# Patient Record
Sex: Female | Born: 1990 | Race: White | Hispanic: No | Marital: Single | Smoking: Current every day smoker
Health system: Southern US, Community
[De-identification: ages and names within clinical notes are randomized; demographics above are authoritative.]

## PROBLEM LIST (undated history)

## (undated) ENCOUNTER — Inpatient Hospital Stay (HOSPITAL_COMMUNITY): Payer: Self-pay

## (undated) DIAGNOSIS — G039 Meningitis, unspecified: Secondary | ICD-10-CM

## (undated) DIAGNOSIS — F329 Major depressive disorder, single episode, unspecified: Secondary | ICD-10-CM

## (undated) DIAGNOSIS — M5430 Sciatica, unspecified side: Secondary | ICD-10-CM

## (undated) DIAGNOSIS — F32A Depression, unspecified: Secondary | ICD-10-CM

## (undated) HISTORY — PX: WISDOM TOOTH EXTRACTION: SHX21

## (undated) HISTORY — PX: TONSILLECTOMY: SUR1361

---
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
## 2012-10-19 ENCOUNTER — Emergency Department: Payer: Self-pay | Admitting: Internal Medicine

## 2012-11-01 ENCOUNTER — Emergency Department: Payer: Self-pay | Admitting: Emergency Medicine

## 2015-11-30 ENCOUNTER — Emergency Department (HOSPITAL_COMMUNITY): Payer: Self-pay

## 2015-11-30 ENCOUNTER — Emergency Department (HOSPITAL_COMMUNITY)
Admission: EM | Admit: 2015-11-30 | Discharge: 2015-11-30 | Disposition: A | Discharge status: Home / Self Care | Payer: Self-pay | Attending: Emergency Medicine | Admitting: Emergency Medicine

## 2015-11-30 ENCOUNTER — Other Ambulatory Visit: Payer: Self-pay

## 2015-11-30 ENCOUNTER — Encounter (HOSPITAL_COMMUNITY): Payer: Self-pay

## 2015-11-30 DIAGNOSIS — R1013 Epigastric pain: Secondary | ICD-10-CM | POA: Insufficient documentation

## 2015-11-30 DIAGNOSIS — F17210 Nicotine dependence, cigarettes, uncomplicated: Secondary | ICD-10-CM | POA: Insufficient documentation

## 2015-11-30 DIAGNOSIS — R45851 Suicidal ideations: Secondary | ICD-10-CM | POA: Insufficient documentation

## 2015-11-30 HISTORY — DX: Major depressive disorder, single episode, unspecified: F32.9

## 2015-11-30 HISTORY — DX: Depression, unspecified: F32.A

## 2015-11-30 LAB — CBC
HEMATOCRIT: 44.7 % (ref 36.0–46.0)
HEMOGLOBIN: 14.8 g/dL (ref 12.0–15.0)
MCH: 29.1 pg (ref 26.0–34.0)
MCHC: 33.1 g/dL (ref 30.0–36.0)
MCV: 87.8 fL (ref 78.0–100.0)
Platelets: 415 10*3/uL — ABNORMAL HIGH (ref 150–400)
RBC: 5.09 MIL/uL (ref 3.87–5.11)
RDW: 13 % (ref 11.5–15.5)
WBC: 10.1 10*3/uL (ref 4.0–10.5)

## 2015-11-30 LAB — I-STAT BETA HCG BLOOD, ED (MC, WL, AP ONLY): I-stat hCG, quantitative: <5 m[IU]/mL (ref <5)

## 2015-11-30 LAB — COMPREHENSIVE METABOLIC PANEL
ALBUMIN: 4 g/dL (ref 3.5–5.0)
ALK PHOS: 77 U/L (ref 38–126)
ALT: 26 U/L (ref 14–54)
ANION GAP: 10 (ref 5–15)
AST: 25 U/L (ref 15–41)
BILIRUBIN TOTAL: 0.4 mg/dL (ref 0.3–1.2)
BUN: 14 mg/dL (ref 6–20)
CALCIUM: 9.1 mg/dL (ref 8.9–10.3)
CO2: 24 mmol/L (ref 22–32)
CREATININE: 0.62 mg/dL (ref 0.44–1.00)
Chloride: 104 mmol/L (ref 101–111)
GFR calc Af Amer: >60 mL/min (ref >60)
GFR calc non Af Amer: >60 mL/min (ref >60)
GLUCOSE: 88 mg/dL (ref 65–99)
Potassium: 3.8 mmol/L (ref 3.5–5.1)
Sodium: 138 mmol/L (ref 135–145)
TOTAL PROTEIN: 7.3 g/dL (ref 6.5–8.1)

## 2015-11-30 LAB — ACETAMINOPHEN LEVEL: Acetaminophen (Tylenol), Serum: <10 ug/mL — ABNORMAL LOW (ref 10–30)

## 2015-11-30 LAB — SALICYLATE LEVEL: Salicylate Lvl: <7 mg/dL (ref 2.8–30.0)

## 2015-11-30 LAB — ETHANOL: Alcohol, Ethyl (B): <5 mg/dL (ref <5)

## 2015-11-30 MED ORDER — GI COCKTAIL ~~LOC~~
30.0000 mL | Freq: Once | ORAL | Status: AC
Start: 2015-11-30 — End: 2015-11-30
  Administered 2015-11-30: 30 mL via ORAL
  Filled 2015-11-30: qty 30

## 2015-11-30 MED ORDER — ONDANSETRON 4 MG PO TBDP: 4.0000 mg | ORAL_TABLET | Freq: Three times a day (TID) | ORAL | 0 refills | Status: DC | PRN

## 2015-11-30 MED ORDER — ONDANSETRON 4 MG PO TBDP
4.0000 mg | ORAL_TABLET | Freq: Once | ORAL | Status: AC
  Administered 2015-11-30: 4 mg via ORAL
  Filled 2015-11-30: qty 1

## 2015-11-30 MED ORDER — ACETAMINOPHEN 500 MG PO TABS
1000.0000 mg | ORAL_TABLET | Freq: Once | ORAL | Status: AC
  Administered 2015-11-30: 1000 mg via ORAL
  Filled 2015-11-30: qty 2

## 2015-11-30 NOTE — Discharge Instructions (Signed)
Follow up with your family doc.  °Try zantac 150mg twice a day.  °

## 2015-11-30 NOTE — ED Notes (Signed)
Patient belongings inventory and placed in 1 container, and 1 bag.

## 2015-11-30 NOTE — ED Triage Notes (Signed)
Patient complains of anterior chest pain and cough for 1 day. On further questioning became tearful and states that she is depressed and has thoughts of hurting herself but doesn't think she could actually do it.

## 2015-11-30 NOTE — ED Notes (Signed)
Staffing office called for sitter. Changed into scrubs, security called to wand

## 2015-11-30 NOTE — ED Provider Notes (Signed)
[facility]-EMERGENCY DEPT Provider Note   CSN: [id number] Arrival date & time: 11/30/15  1114  By signing my name below, I, Kayla Drake, attest that this documentation has been prepared under the direction and in the presence of Kayla Planan Urania Pearlman, DO . Electronically Signed: Freida Busmaniana Drake, Scribe. 11/30/2015. 12:33 PM.  History   Chief Complaint Chief Complaint  Patient presents with  . Chest Pain/suicidal   The history is provided by the patient. No language interpreter was used.    HPI Comments:  Kayla Drake is a 25 y.o. female who presents to the Emergency Department complaining of intermittent, sharp, substernal pain since 0500 this AM that woke her from sleep. She also notes pain across her ribs and cramping diffuse abdominal pain.  She denies recent injury/fall. She notes associated vomiting today. She also notes cough x 3-4 days. She denies diarrhea, fever, and congestion.  No h/o PE/DVT, CA or long periods of immobilization. No alleviating factors noted; no treatments tried.   Pt mentions SI which she has been experiencing for years; no plan. She states she will not act upon these thoughts as she takes in strays and they are depending on her. She has tried to harm herself in the past but states she couldn't actually go through it. No diagnosed psych history.   Past Medical History:  Diagnosis Date  . Depression     There are no active problems to display for this patient.   History reviewed. No pertinent surgical history.  OB History    No data available       Home Medications    Prior to Admission medications   Medication Sig Start Date End Date Taking? Authorizing Provider  ondansetron (ZOFRAN ODT) 4 MG disintegrating tablet Take 1 tablet (4 mg total) by mouth every 8 (eight) hours as needed for nausea or vomiting. 11/30/15   Kayla Planan Cordero Surette, DO    Family History No family history on file.  Social History Social History  Substance Use Topics  . Smoking status:  Current Every Day Smoker    Types: Cigarettes  . Smokeless tobacco: Never Used  . Alcohol use 0.6 oz/week    1 Glasses of wine per week     Allergies   Patient has no known allergies.   Review of Systems Review of Systems  Constitutional: Negative for chills and fever.  HENT: Negative for congestion and rhinorrhea.   Eyes: Negative for redness and visual disturbance.  Respiratory: Positive for cough. Negative for shortness of breath and wheezing.   Cardiovascular: Positive for chest pain. Negative for palpitations.  Gastrointestinal: Positive for abdominal pain and vomiting.  Genitourinary: Negative for dysuria and urgency.  Musculoskeletal: Negative for arthralgias and myalgias.  Skin: Negative for pallor and wound.  Neurological: Negative for dizziness and headaches.  Psychiatric/Behavioral: Positive for suicidal ideas. Negative for self-injury.     Physical Exam Updated Vital Signs BP 110/76 (BP Location: Right Arm)   Pulse 80   Temp 98 F (36.7 C) (Oral)   Resp 16   SpO2 100%   Physical Exam  Constitutional: She is oriented to person, place, and time. She appears well-developed and well-nourished. No distress.  HENT:  Head: Normocephalic and atraumatic.  Eyes: EOM are normal. Pupils are equal, round, and reactive to light.  Neck: Normal range of motion. Neck supple.  Cardiovascular: Normal rate and regular rhythm.  Exam reveals no gallop and no friction rub.   No murmur heard. Pulmonary/Chest: Effort normal. No respiratory distress. She has  no wheezes. She has no rales.  Abdominal: Soft. She exhibits no distension. There is tenderness in the epigastric area. There is negative Murphy's sign.  Musculoskeletal: She exhibits no edema or tenderness.  Neurological: She is alert and oriented to person, place, and time.  Skin: Skin is warm and dry. She is not diaphoretic.  Psychiatric: She has a normal mood and affect. Her behavior is normal.  Nursing note and vitals  reviewed.    ED Treatments / Results  DIAGNOSTIC STUDIES:  Oxygen Saturation is 100% on RA, normal by my interpretation.    COORDINATION OF CARE:  12:17 PM Discussed treatment plan with pt at bedside and pt agreed to plan.   Labs (all labs ordered are listed, but only abnormal results are displayed) Labs Reviewed  ACETAMINOPHEN LEVEL - Abnormal; Notable for the following:       Result Value   Acetaminophen (Tylenol), Serum <10 (*)    All other components within normal limits  CBC - Abnormal; Notable for the following:    Platelets 415 (*)    All other components within normal limits  COMPREHENSIVE METABOLIC PANEL  ETHANOL  SALICYLATE LEVEL  I-STAT BETA HCG BLOOD, ED ([facility], [facility], [facility] ONLY)    EKG  EKG Interpretation  Date/Time:  Wednesday November 30 2015 11:25:05 EST Ventricular Rate:  77 PR Interval:  132 QRS Duration: 88 QT Interval:  412 QTC Calculation: 466 R Axis:   100 Text Interpretation:  Normal sinus rhythm with sinus arrhythmia Rightward axis Borderline ECG No old tracing to compare Confirmed by Kayla Howley MD, DANIEL [phone]([id number]) on 11/30/2015 12:30:51 PM       Radiology Dg Chest 2 View  Result Date: 11/30/2015 CLINICAL DATA:  Chest pain, cough. EXAM: CHEST  2 VIEW COMPARISON:  Radiographs of December 29, 2008. FINDINGS: The heart size and mediastinal contours are within normal limits. Both lungs are clear. No pneumothorax or pleural effusion is noted. The visualized skeletal structures are unremarkable. IMPRESSION: No active cardiopulmonary disease. Electronically Signed   By: Lupita RaiderJames  Green Jr, M.D.   On: 11/30/2015 12:28    Procedures Procedures (including critical care time)  Medications Ordered in ED Medications  gi cocktail (Maalox,Lidocaine,Donnatal) (30 mLs Oral Given 11/30/15 1233)  ondansetron (ZOFRAN-ODT) disintegrating tablet 4 mg (4 mg Oral Given 11/30/15 1342)  acetaminophen (TYLENOL) tablet 1,000 mg (1,000 mg Oral Given 11/30/15 1342)     Initial  Impression / Assessment and Plan / ED Course  I have reviewed the triage vital signs and the nursing notes.  Pertinent labs & imaging results that were available during my care of the patient were reviewed by me and considered in my medical decision making (see chart for details).  Clinical Course     25 yo F With a chief complaint of epigastric abdominal pain. Per the patient this started this morning. Having a couple episodes of vomiting. Having difficulty keeping down food. On my exam patient with epigastric tenderness no noted right upper quadrant tenderness. Labs are reassuring. Patient on review of systems noted that she was suicidal. Patient states this been going on for quite some time. We'll have TTS evaluate. Feel that she is medically clear this time.  Patient was evaluated by TTS. They feel that she is safe for discharge and outpatient management. Patient had mild improvement with her symptomatology while in the ED. We'll have her start Zantac. PCP follow-up.  4:37 PM:  I have discussed the diagnosis/risks/treatment options with the patient and family and believe the  pt to be eligible for discharge home to follow-up with PCP. We also discussed returning to the ED immediately if new or worsening sx occur. We discussed the sx which are most concerning (e.g., sudden worsening pain, fever, inability to tolerate by mouth) that necessitate immediate return. Medications administered to the patient during their visit and any new prescriptions provided to the patient are listed below.  Medications given during this visit Medications  gi cocktail (Maalox,Lidocaine,Donnatal) (30 mLs Oral Given 11/30/15 1233)  ondansetron (ZOFRAN-ODT) disintegrating tablet 4 mg (4 mg Oral Given 11/30/15 1342)  acetaminophen (TYLENOL) tablet 1,000 mg (1,000 mg Oral Given 11/30/15 1342)     The patient appears reasonably screen and/or stabilized for discharge and I doubt any other medical condition or other [facility]  requiring further screening, evaluation, or treatment in the ED at this time prior to discharge.      Final Clinical Impressions(s) / ED Diagnoses   Final diagnoses:  Epigastric abdominal pain    New Prescriptions Discharge Medication List as of 11/30/2015  2:08 PM    START taking these medications   Details  ondansetron (ZOFRAN ODT) 4 MG disintegrating tablet Take 1 tablet (4 mg total) by mouth every 8 (eight) hours as needed for nausea or vomiting., Starting Wed 11/30/2015, Print       I personally performed the services described in this documentation, which was scribed in my presence. The recorded information has been reviewed and is accurate.      Kayla Plan, DO 11/30/15 1637

## 2015-11-30 NOTE — ED Notes (Signed)
Security wanded pt ?

## 2015-11-30 NOTE — ED Notes (Signed)
ED Provider at bedside to explain discharge.

## 2015-11-30 NOTE — BH Assessment (Signed)
Tele Assessment Note   Kayla Drake is a 25 y.o. female who presented to [facility] on a voluntary basis with complaint of chest/belly pain.  While being assessed, Pt endorsed passive suicidal ideation and depressive symptoms.  Pt reported as follows:  Pt stated that since age 49, she has experienced depressive symptoms.  Recently, these symptoms have increased in intensity.  Current symptoms include:  Passive suicidal ideation ("I've always felt suicidal"), persistent and unremitting despondency, insomnia, physical fatigue, and irritability.  Pt also endorsed recent psychosocial stressors, including starting a new job and fights with her live-in boyfriend.  Pt denied any current suicidal plan or intent.    Pt reported that she has "always felt suicidal" and that she may have made a suicide attempt when a teenager -- she could not recall details, and said she was unsure of whether her cutting herself was an actual suicide attempt.  Pt also stated that she has never been treated by a psychiatrist or a therapist, and she expressed a willingness to meet with such.  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  Pt was dressed in scrubs and appeared appropriately groomed.  Pt had good eye contact.  Demeanor was guarded ("the door is open, and this is personal stuff").  Pt's mood was reported as depressed.  Affect was irritable -- she seemed annoyed that she was being kept in scrubs because "I finally said 'yes' to feeling depressed."  Pt endorsed depressive symptoms including long-standing passive suicidal ideation without plan or intent, despondency, insomnia, physical fatigue, and irritability.  Pt denied substance use concerns, self-injury, and auditory/visual hallucination.  Pt's speech was normal in rate and rhythm; she was somewhat loud.  Thought processes were within normal range.  Thought content was  goal-oriented.  There was no evidence of delusion.  Pt's memory and concentration were grossly intact.   Pt's insight, judgment, and impulse control were deemed fair.  Author consulted with Irving Burton, NP who determined that Pt does not meet inpatient criteria.  Recommend Pt be referred to an appropriate outpatient resource for evaluation and therapy (e.g., [facility] -- [phone]).  Diagnosis: MDD, Recurrent, Moderate  Past Medical History:  Past Medical History:  Diagnosis Date  . Depression     History reviewed. No pertinent surgical history.  Family History: No family history on file.  Social History:  reports that she has been smoking Cigarettes.  She has never used smokeless tobacco. She reports that she drinks about 0.6 oz of alcohol per week . She reports that she does not use drugs.  Additional Social History:  Alcohol / Drug Use Pain Medications: See PTA Prescriptions: See PTA Over the Counter: See PTA History of alcohol / drug use?: No history of alcohol / drug abuse  CIWA: CIWA-Ar BP: 115/76 Pulse Rate: 69 COWS:    PATIENT STRENGTHS: (choose at least two) Average or above average intelligence Capable of independent living Communication skills  Allergies: No Known Allergies  Home Medications:  (Not in a hospital admission)  OB/GYN Status:  No LMP recorded.  General Assessment Data Location of Assessment: [facility] ED TTS Assessment: In system Is this a Tele or Face-to-Face Assessment?: Tele Assessment Is this an Initial Assessment or a Re-assessment for this encounter?: Initial Assessment Marital status: Long term relationship Is patient pregnant?: No Pregnancy Status: No Living Arrangements: Spouse/significant other (Lives with boyfriend/fiance) Can pt return to  current living arrangement?: Yes Admission Status: Voluntary Is patient capable of signing voluntary admission?: Yes Referral Source: Self/Family/Friend Insurance type: Self     Crisis  Care Plan Living Arrangements: Spouse/significant other (Lives with boyfriend/fiance) Name of Psychiatrist: None Name of Therapist: None  Education Status Is patient currently in school?: No  Risk to self with the past 6 months Suicidal Ideation: No-Not Currently/Within Last 6 Months Has patient been a risk to self within the past 6 months prior to admission? : No Suicidal Intent: No Has patient had any suicidal intent within the past 6 months prior to admission? : No Is patient at risk for suicide?: No Suicidal Plan?: No Has patient had any suicidal plan within the past 6 months prior to admission? 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) Intentional Self Injurious Behavior: None Family Suicide History: No Recent stressful life event(s): Conflict (Comment), Other (Comment) (Conflict with live-in boyfriend; new job) Persecutory voices/beliefs?: No Depression: Yes Depression Symptoms: Despondent, Insomnia, Fatigue, Feeling angry/irritable Substance abuse history and/or treatment for substance abuse?: No Suicide prevention information given to non-admitted patients: Not applicable  Risk to Others within the past 6 months Homicidal Ideation: No Does patient have any lifetime risk of violence toward others beyond the six months prior to admission? : No Thoughts of Harm to Others: No Current Homicidal Intent: No Current Homicidal Plan: No Access to Homicidal Means: No History of harm to others?: No Assessment of Violence: None Noted Does patient have access to weapons?: No Criminal Charges Pending?: No Does patient have a court date: No Is patient on probation?: No  Psychosis Hallucinations: None noted Delusions: None noted  Mental Status Report Appearance/Hygiene: In scrubs Eye  Contact: Good Motor Activity: Unremarkable, Freedom of movement Speech: Logical/coherent, Unremarkable, Loud Level of Consciousness: Alert, Irritable Mood: Depressed, Irritable Affect: Irritable Anxiety Level: None Thought Processes: Relevant, Coherent Judgement: Unimpaired Orientation: Person, Place, Time, Situation Obsessive Compulsive Thoughts/Behaviors: None  Cognitive Functioning Concentration: Normal Memory: Recent Intact, Remote Intact IQ: Average Insight: Fair Impulse Control: Fair Appetite: Good Sleep: Decreased Vegetative Symptoms: None  ADLScreening [facility] Assessment Services) Patient's cognitive ability adequate to safely complete daily activities?: Yes Patient able to express need for assistance with ADLs?: Yes Independently performs ADLs?: Yes (appropriate for developmental age)  Prior Inpatient Therapy Prior Inpatient Therapy: No  Prior Outpatient Therapy Prior Outpatient Therapy: No Does patient have an ACCT team?: No Does patient have Intensive In-House Services?  : No Does patient have [facility] services? : No Does patient have P4CC services?: No  ADL Screening (condition at time of admission) Patient's cognitive ability adequate to safely complete daily activities?: Yes Is the patient deaf or have difficulty hearing?: No Does the patient have difficulty seeing, even when wearing glasses/contacts?: No Does the patient have difficulty concentrating, remembering, or making decisions?: No Patient able to express need for assistance with ADLs?: Yes Does the patient have difficulty dressing or bathing?: No Independently performs ADLs?: Yes (appropriate for developmental age) Does the patient have difficulty walking or climbing stairs?: No Weakness of Legs: None Weakness of Arms/Hands: None  Home Assistive Devices/Equipment Home Assistive Devices/Equipment: None  Therapy Consults (therapy consults require a physician order) PT Evaluation Needed: No OT  Evalulation Needed: No SLP Evaluation Needed: No Abuse/Neglect Assessment (Assessment to be complete while patient is alone) Physical Abuse: Denies  Verbal Abuse: Denies Sexual Abuse: Denies Exploitation of patient/patient's resources: Denies Self-Neglect: Denies Values / Beliefs Cultural Requests During Hospitalization: None Spiritual Requests During Hospitalization: None Consults Spiritual Care Consult Needed: No Social Work Consult Needed: No Merchant navy officerAdvance Directives (For Healthcare) Does Patient Have a Medical Advance Directive?: No Would patient like information on creating a medical advance directive?: No - Patient declined    Additional Information 1:1 In Past 12 Months?: No CIRT Risk: No Elopement Risk: No Does patient have medical clearance?: Yes     Disposition:  Disposition Initial Assessment Completed for this Encounter: Yes Disposition of Patient: Outpatient treatment Type of outpatient treatment: Adult (Per L. Arville CareParks, NP, Pt does not meet inpt criteria)  Dorris Fetchugene T Shean Gerding 11/30/2015 1:35 PM

## 2016-11-04 ENCOUNTER — Other Ambulatory Visit: Payer: Self-pay

## 2016-11-04 ENCOUNTER — Encounter: Payer: Self-pay | Admitting: Emergency Medicine

## 2016-11-04 ENCOUNTER — Inpatient Hospital Stay
Admission: EM | Admit: 2016-11-04 | Discharge: 2016-11-07 | DRG: 759 | Disposition: A | Discharge status: Home / Self Care | Payer: Self-pay | Attending: Obstetrics and Gynecology | Admitting: Obstetrics and Gynecology

## 2016-11-04 ENCOUNTER — Emergency Department: Payer: Self-pay

## 2016-11-04 DIAGNOSIS — R509 Fever, unspecified: Secondary | ICD-10-CM

## 2016-11-04 DIAGNOSIS — D72829 Elevated white blood cell count, unspecified: Secondary | ICD-10-CM

## 2016-11-04 DIAGNOSIS — N7093 Salpingitis and oophoritis, unspecified: Principal | ICD-10-CM | POA: Diagnosis present

## 2016-11-04 DIAGNOSIS — Z3202 Encounter for pregnancy test, result negative: Secondary | ICD-10-CM | POA: Diagnosis present

## 2016-11-04 DIAGNOSIS — N739 Female pelvic inflammatory disease, unspecified: Secondary | ICD-10-CM

## 2016-11-04 DIAGNOSIS — F17210 Nicotine dependence, cigarettes, uncomplicated: Secondary | ICD-10-CM | POA: Diagnosis present

## 2016-11-04 DIAGNOSIS — R1031 Right lower quadrant pain: Secondary | ICD-10-CM | POA: Diagnosis present

## 2016-11-04 HISTORY — DX: Meningitis, unspecified: G03.9

## 2016-11-04 HISTORY — DX: Sciatica, unspecified side: M54.30

## 2016-11-04 LAB — URINALYSIS, COMPLETE (UACMP) WITH MICROSCOPIC
BILIRUBIN URINE: NEGATIVE
Bacteria, UA: NONE SEEN
Glucose, UA: NEGATIVE mg/dL
KETONES UR: 5 mg/dL — AB
NITRITE: NEGATIVE
Protein, ur: 100 mg/dL — AB
Specific Gravity, Urine: 1.031 — ABNORMAL HIGH (ref 1.005–1.030)
pH: 5 (ref 5.0–8.0)

## 2016-11-04 LAB — COMPREHENSIVE METABOLIC PANEL
ALBUMIN: 3.6 g/dL (ref 3.5–5.0)
ALK PHOS: 78 U/L (ref 38–126)
ALT: 21 U/L (ref 14–54)
AST: 29 U/L (ref 15–41)
Anion gap: 11 (ref 5–15)
BILIRUBIN TOTAL: 0.9 mg/dL (ref 0.3–1.2)
BUN: 16 mg/dL (ref 6–20)
CO2: 19 mmol/L — ABNORMAL LOW (ref 22–32)
Calcium: 8.6 mg/dL — ABNORMAL LOW (ref 8.9–10.3)
Chloride: 104 mmol/L (ref 101–111)
Creatinine, Ser: 0.82 mg/dL (ref 0.44–1.00)
GFR calc Af Amer: >60 mL/min (ref >60)
GLUCOSE: 165 mg/dL — AB (ref 65–99)
Potassium: 3.4 mmol/L — ABNORMAL LOW (ref 3.5–5.1)
Sodium: 134 mmol/L — ABNORMAL LOW (ref 135–145)
TOTAL PROTEIN: 7.4 g/dL (ref 6.5–8.1)

## 2016-11-04 LAB — CHLAMYDIA/NGC RT PCR (ARMC ONLY)
CHLAMYDIA TR: NOT DETECTED
N gonorrhoeae: NOT DETECTED

## 2016-11-04 LAB — CBC
HCT: 40 % (ref 35.0–47.0)
Hemoglobin: 13 g/dL (ref 12.0–16.0)
MCH: 28 pg (ref 26.0–34.0)
MCHC: 32.6 g/dL (ref 32.0–36.0)
MCV: 86 fL (ref 80.0–100.0)
Platelets: 280 10*3/uL (ref 150–440)
RBC: 4.65 MIL/uL (ref 3.80–5.20)
RDW: 13.4 % (ref 11.5–14.5)
WBC: 22.7 10*3/uL — AB (ref 3.6–11.0)

## 2016-11-04 LAB — WET PREP, GENITAL
Clue Cells Wet Prep HPF POC: NONE SEEN
SPERM: NONE SEEN
Trich, Wet Prep: NONE SEEN

## 2016-11-04 LAB — LIPASE, BLOOD: Lipase: 20 U/L (ref 11–51)

## 2016-11-04 LAB — POCT PREGNANCY, URINE: PREG TEST UR: NEGATIVE

## 2016-11-04 MED ORDER — GENTAMICIN SULFATE 40 MG/ML IJ SOLN
5.0000 mg/kg | INTRAMUSCULAR | Status: DC
  Administered 2016-11-04 – 2016-11-06 (×3): 320 mg via INTRAVENOUS
  Filled 2016-11-04 (×5): qty 8

## 2016-11-04 MED ORDER — SODIUM CHLORIDE 0.9 % IV BOLUS (SEPSIS)
1000.0000 mL | Freq: Once | INTRAVENOUS | Status: AC
  Administered 2016-11-04: 1000 mL via INTRAVENOUS

## 2016-11-04 MED ORDER — MAGNESIUM HYDROXIDE 400 MG/5ML PO SUSP
30.0000 mL | Freq: Every day | ORAL | Status: DC | PRN
  Administered 2016-11-06: 30 mL via ORAL
  Filled 2016-11-04 (×2): qty 30

## 2016-11-04 MED ORDER — GENTAMICIN SULFATE 40 MG/ML IJ SOLN
2.5000 mg/kg | Freq: Once | INTRAVENOUS | Status: DC
  Filled 2016-11-04: qty 5

## 2016-11-04 MED ORDER — HYDROMORPHONE HCL 1 MG/ML IJ SOLN
0.5000 mg | Freq: Once | INTRAMUSCULAR | Status: AC
  Administered 2016-11-04: 0.5 mg via INTRAVENOUS
  Filled 2016-11-04: qty 1

## 2016-11-04 MED ORDER — ONDANSETRON HCL 4 MG/2ML IJ SOLN
4.0000 mg | Freq: Four times a day (QID) | INTRAMUSCULAR | Status: DC | PRN
  Administered 2016-11-05: 4 mg via INTRAVENOUS
  Filled 2016-11-04: qty 2

## 2016-11-04 MED ORDER — ZOLPIDEM TARTRATE 5 MG PO TABS: 5.0000 mg | ORAL_TABLET | Freq: Every evening | ORAL | Status: DC | PRN

## 2016-11-04 MED ORDER — MAGNESIUM CITRATE PO SOLN
1.0000 | Freq: Once | ORAL | Status: DC | PRN
  Filled 2016-11-04: qty 296

## 2016-11-04 MED ORDER — ALUM & MAG HYDROXIDE-SIMETH 200-200-20 MG/5ML PO SUSP: 30.0000 mL | ORAL | Status: DC | PRN

## 2016-11-04 MED ORDER — DOCUSATE SODIUM 100 MG PO CAPS
100.0000 mg | ORAL_CAPSULE | Freq: Two times a day (BID) | ORAL | Status: DC
  Administered 2016-11-04 – 2016-11-07 (×6): 100 mg via ORAL
  Filled 2016-11-04 (×6): qty 1

## 2016-11-04 MED ORDER — PROMETHAZINE HCL 25 MG/ML IJ SOLN
12.5000 mg | Freq: Four times a day (QID) | INTRAMUSCULAR | Status: DC | PRN
  Administered 2016-11-04: 12.5 mg via INTRAVENOUS
  Filled 2016-11-04: qty 1

## 2016-11-04 MED ORDER — DOXYCYCLINE HYCLATE 100 MG IV SOLR
100.0000 mg | Freq: Two times a day (BID) | INTRAVENOUS | Status: AC
  Administered 2016-11-04: 100 mg via INTRAVENOUS
  Filled 2016-11-04: qty 100

## 2016-11-04 MED ORDER — CLINDAMYCIN PHOSPHATE 600 MG/50ML IV SOLN: 600.0000 mg | Freq: Once | INTRAVENOUS | Status: DC

## 2016-11-04 MED ORDER — ONDANSETRON HCL 4 MG PO TABS: 4.0000 mg | ORAL_TABLET | Freq: Four times a day (QID) | ORAL | Status: DC | PRN

## 2016-11-04 MED ORDER — BISACODYL 5 MG PO TBEC: 5.0000 mg | DELAYED_RELEASE_TABLET | Freq: Every day | ORAL | Status: DC | PRN

## 2016-11-04 MED ORDER — MORPHINE SULFATE (PF) 4 MG/ML IV SOLN
4.0000 mg | INTRAVENOUS | Status: DC | PRN
  Administered 2016-11-04 (×2): 4 mg via INTRAVENOUS
  Filled 2016-11-04 (×2): qty 1

## 2016-11-04 MED ORDER — HYDROMORPHONE HCL 1 MG/ML IJ SOLN
0.2000 mg | INTRAMUSCULAR | Status: DC | PRN
  Administered 2016-11-04: 0.5 mg via INTRAVENOUS
  Filled 2016-11-04: qty 1

## 2016-11-04 MED ORDER — LACTATED RINGERS IV SOLN
INTRAVENOUS | Status: DC
  Administered 2016-11-04 – 2016-11-07 (×5): via INTRAVENOUS

## 2016-11-04 MED ORDER — OXYCODONE-ACETAMINOPHEN 5-325 MG PO TABS
1.0000 | ORAL_TABLET | ORAL | Status: DC | PRN
  Administered 2016-11-04 – 2016-11-05 (×6): 2 via ORAL
  Administered 2016-11-06 (×3): 1 via ORAL
  Administered 2016-11-06 (×2): 2 via ORAL
  Administered 2016-11-07: 1 via ORAL
  Administered 2016-11-07: 2 via ORAL
  Filled 2016-11-04 (×3): qty 2
  Filled 2016-11-04 (×2): qty 1
  Filled 2016-11-04 (×2): qty 2
  Filled 2016-11-04: qty 1
  Filled 2016-11-04 (×2): qty 2
  Filled 2016-11-04: qty 1
  Filled 2016-11-04 (×2): qty 2

## 2016-11-04 MED ORDER — IBUPROFEN 600 MG PO TABS
600.0000 mg | ORAL_TABLET | Freq: Four times a day (QID) | ORAL | Status: DC | PRN
  Administered 2016-11-04 – 2016-11-07 (×6): 600 mg via ORAL
  Filled 2016-11-04 (×7): qty 1

## 2016-11-04 MED ORDER — DOXYCYCLINE HYCLATE 100 MG IV SOLR
100.0000 mg | Freq: Two times a day (BID) | INTRAVENOUS | Status: AC
  Administered 2016-11-05: 100 mg via INTRAVENOUS
  Filled 2016-11-04: qty 100

## 2016-11-04 MED ORDER — IOPAMIDOL (ISOVUE-300) INJECTION 61%
100.0000 mL | Freq: Once | INTRAVENOUS | Status: AC | PRN
  Administered 2016-11-04: 100 mL via INTRAVENOUS

## 2016-11-04 NOTE — ED Notes (Signed)
Pt in ct 

## 2016-11-04 NOTE — ED Provider Notes (Signed)
[facility] Emergency Department Provider Note    First MD Initiated Contact with Patient 11/04/16 1223     (approximate)  I have reviewed the triage vital signs and the nursing notes.   HISTORY  Chief Complaint Abdominal Pain    HPI Kayla Drake is a 26 y.o. female this is a chief complaint of progressively worsening diffuse periumbilical abdominal pain with radiation right lower quadrant over the past 2-3 days.  Does feel that she is having some bloating and low-grade temperatures.  Has had nausea and inability to keep any food down.  Denies any dysuria.  Denies any vaginal bleeding but has noted a change in vaginal discharge.  Past Medical History:  Diagnosis Date  . Depression    No family history on file. History reviewed. No pertinent surgical history. There are no active problems to display for this patient.     Prior to Admission medications   Medication Sig Start Date End Date Taking? Authorizing Provider  acetaminophen (TYLENOL) 500 MG tablet Take 500-1,000 mg every 6 (six) hours as needed by mouth.   Yes [provider]  Diphenhydramine-PE-APAP (SEVERE COLD & COUGH NIGHTTIME PO) Take 1 tablet 2 (two) times daily by mouth.   Yes [provider]  ondansetron (ZOFRAN ODT) 4 MG disintegrating tablet Take 1 tablet (4 mg total) by mouth every 8 (eight) hours as needed for nausea or vomiting. Patient not taking: Reported on 11/04/2016 11/30/15   Melene PlanFloyd, Dan, DO    Allergies Patient has no known allergies.    Social History Social History   Tobacco Use  . Smoking status: Current Every Day Smoker    Types: Cigarettes  . Smokeless tobacco: Never Used  Substance Use Topics  . Alcohol use: Yes    Alcohol/week: 0.6 oz    Types: 1 Glasses of wine per week  . Drug use: No    Review of Systems Patient denies headaches, rhinorrhea, blurry vision, numbness, shortness of breath, chest pain, edema, cough, abdominal pain,  nausea, vomiting, diarrhea, dysuria, fevers, rashes or hallucinations unless otherwise stated above in HPI. ____________________________________________   PHYSICAL EXAM:  VITAL SIGNS: Vitals:   11/04/16 1400 11/04/16 1430  BP: 113/70 (!) 93/56  Pulse: 85   Resp:    Temp:    SpO2: 100%     Constitutional: Alert and oriented. Ill appearing but in no acute distress. Eyes: Conjunctivae are normal.  Head: Atraumatic. Nose: No congestion/rhinnorhea. Mouth/Throat: Mucous membranes are moist.   Neck: No stridor. Painless ROM.  Cardiovascular: Normal rate, regular rhythm. Grossly normal heart sounds.  Good peripheral circulation. Respiratory: Normal respiratory effort.  No retractions. Lungs CTAB. Gastrointestinal: Soft with RLQ ttp. No distention. No abdominal bruits. No CVA tenderness. Genitourinary: + CMT with copious purulent discharge from os.  + right adnexal ttp Musculoskeletal: No lower extremity tenderness nor edema.  No joint effusions. Neurologic:  Normal speech and language. No gross focal neurologic deficits are appreciated. No facial droop Skin:  Skin is warm, dry and intact. No rash noted. Psychiatric: Mood and affect are normal. Speech and behavior are normal.  ____________________________________________   LABS (all labs ordered are listed, but only abnormal results are displayed)  Results for orders placed or performed during the hospital encounter of 11/04/16 (from the past 24 hour(s))  Lipase, blood     Status: None   Collection Time: 11/04/16 11:04 AM  Result Value Ref Range   Lipase 20 11 - 51 U/L  Comprehensive metabolic panel  Status: Abnormal   Collection Time: 11/04/16 11:04 AM  Result Value Ref Range   Sodium 134 (L) 135 - 145 mmol/L   Potassium 3.4 (L) 3.5 - 5.1 mmol/L   Chloride 104 101 - 111 mmol/L   CO2 19 (L) 22 - 32 mmol/L   Glucose, Bld 165 (H) 65 - 99 mg/dL   BUN 16 6 - 20 mg/dL   Creatinine, Ser 1.61 0.44 - 1.00 mg/dL   Calcium 8.6 (L)  8.9 - 10.3 mg/dL   Total Protein 7.4 6.5 - 8.1 g/dL   Albumin 3.6 3.5 - 5.0 g/dL   AST 29 15 - 41 U/L   ALT 21 14 - 54 U/L   Alkaline Phosphatase 78 38 - 126 U/L   Total Bilirubin 0.9 0.3 - 1.2 mg/dL   GFR calc non Af Amer >60 >60 mL/min   GFR calc Af Amer >60 >60 mL/min   Anion gap 11 5 - 15  CBC     Status: Abnormal   Collection Time: 11/04/16 11:04 AM  Result Value Ref Range   WBC 22.7 (H) 3.6 - 11.0 K/uL   RBC 4.65 3.80 - 5.20 MIL/uL   Hemoglobin 13.0 12.0 - 16.0 g/dL   HCT 09.6 04.5 - 40.9 %   MCV 86.0 80.0 - 100.0 fL   MCH 28.0 26.0 - 34.0 pg   MCHC 32.6 32.0 - 36.0 g/dL   RDW 81.1 91.4 - 78.2 %   Platelets 280 150 - 440 K/uL  Urinalysis, Complete w Microscopic     Status: Abnormal   Collection Time: 11/04/16 11:04 AM  Result Value Ref Range   Color, Urine AMBER (A) YELLOW   APPearance CLOUDY (A) CLEAR   Specific Gravity, Urine 1.031 (H) 1.005 - 1.030   pH 5.0 5.0 - 8.0   Glucose, UA NEGATIVE NEGATIVE mg/dL   Hgb urine dipstick SMALL (A) NEGATIVE   Bilirubin Urine NEGATIVE NEGATIVE   Ketones, ur 5 (A) NEGATIVE mg/dL   Protein, ur 956 (A) NEGATIVE mg/dL   Nitrite NEGATIVE NEGATIVE   Leukocytes, UA TRACE (A) NEGATIVE   RBC / HPF 6-30 0 - 5 RBC/hpf   WBC, UA 6-30 0 - 5 WBC/hpf   Bacteria, UA NONE SEEN NONE SEEN   Squamous Epithelial / LPF TOO NUMEROUS TO COUNT (A) NONE SEEN   Mucus PRESENT    Amorphous Crystal PRESENT   Pregnancy, urine POC     Status: None   Collection Time: 11/04/16 11:15 AM  Result Value Ref Range   Preg Test, Ur NEGATIVE NEGATIVE  Wet prep, genital     Status: Abnormal   Collection Time: 11/04/16  2:03 PM  Result Value Ref Range   Yeast Wet Prep HPF POC PRESENT (A) NONE SEEN   Trich, Wet Prep NONE SEEN NONE SEEN   Clue Cells Wet Prep HPF POC NONE SEEN NONE SEEN   WBC, Wet Prep HPF POC MODERATE (A) NONE SEEN   Sperm NONE SEEN    ____________________________________________  EKG____________________________________________  RADIOLOGY I  personally reviewed all radiographic images ordered to evaluate for the above acute complaints and reviewed radiology reports and findings.  These findings were personally discussed with the patient.  Please see medical record for radiology report.   ____________________________________________   PROCEDURES  Procedure(s) performed:  Procedures    Critical Care performed: yes CRITICAL CARE Performed by: Willy Eddy   Total critical care time: 35 minutes  Critical care time was exclusive of separately billable procedures and treating  other patients.  Critical care was necessary to treat or prevent imminent or life-threatening deterioration.  Critical care was time spent personally by me on the following activities: development of treatment plan with patient and/or surrogate as well as nursing, discussions with consultants, evaluation of patient's response to treatment, examination of patient, obtaining history from patient or surrogate, ordering and performing treatments and interventions, ordering and review of laboratory studies, ordering and review of radiographic studies, pulse oximetry and re-evaluation of patient's condition.  ____________________________________________   INITIAL IMPRESSION / ASSESSMENT AND PLAN / ED COURSE  Pertinent labs & imaging results that were available during my care of the patient were reviewed by me and considered in my medical decision making (see chart for details).  DDX: pid, toa, appy, stone, uti, torsion  Kayla Drake is a 26 y.o. who presents to the ED with acute onset abdominal pain as described below.  Patient with low-grade temperature but markedly elevated leukocytosis.  CT imaging ordered for the above differential shows evidence of adnexal cystic structure concerning for tubo-ovarian abscess.  Based on her pelvic exam with purulent discharge with positive cervical motion tenderness I do believe this is more consistent with PID  with tubo-ovarian abscess than other pathology such as torsion.  Patient is not pregnant.  Improved with IV fluids.  Will start on broad-spectrum antibiotics.  I spoke with Dr. Valentino Saxon of OB/GYN who agrees to admit patient for further evaluation and management.      ____________________________________________   FINAL CLINICAL IMPRESSION(S) / ED DIAGNOSES  Final diagnoses:  Pelvic inflammatory disease  Tubo-ovarian abscess      NEW MEDICATIONS STARTED DURING THIS VISIT:  This SmartLink is deprecated. Use AVSMEDLIST instead to display the medication list for a patient.   Note:  This document was prepared using Dragon voice recognition software and may include unintentional dictation errors.    Willy Eddy, MD 11/04/16 [phone]

## 2016-11-04 NOTE — ED Notes (Signed)
First Nurse Note:  Patient reports abdominal pain x 2 days.  Patient denies vomiting, reports occasional diarrhea.  Patient states pain began after her period ended.  Patient reports she hasn't wanted to eat or drink.

## 2016-11-04 NOTE — ED Notes (Signed)
Spoke with Diplomatic Services operational officersecretary - pt has been admitted by no bed. Per bed placement - melissa? - the order is not in computer. Order released for bed assignment.

## 2016-11-04 NOTE — ED Triage Notes (Signed)
C/O abdominal pain x 3 days.  C/O lower abdominal pain and bloating.

## 2016-11-04 NOTE — Progress Notes (Addendum)
ANTIBIOTIC CONSULT NOTE - INITIAL  Pharmacy Consult for gentamicin Indication: TOA/PID  No Known Allergies  Patient Measurements: Height: 5\' 3"  (160 cm) Weight: 180 lb (81.6 kg) IBW/kg (Calculated) : 52.4 Adjusted Body Weight:   Vital Signs: Temp: 99.8 F (37.7 C) (11/04 1103) Temp Source: Oral (11/04 1103) BP: 93/56 (11/04 1430) Pulse Rate: 85 (11/04 1400) Intake/Output from previous day: No intake/output data recorded. Intake/Output from this shift: No intake/output data recorded.  Labs: Recent Labs    11/04/16 1104  WBC 22.7*  HGB 13.0  PLT 280  CREATININE 0.82   Estimated Creatinine Clearance: 107.1 mL/min (by C-G formula based on SCr of 0.82 mg/dL). No results for input(s): VANCOTROUGH, VANCOPEAK, VANCORANDOM, GENTTROUGH, GENTPEAK, GENTRANDOM, TOBRATROUGH, TOBRAPEAK, TOBRARND, AMIKACINPEAK, AMIKACINTROU, AMIKACIN in the last 72 hours.   Microbiology: Recent Results (from the past 720 hour(s))  Wet prep, genital     Status: Abnormal   Collection Time: 11/04/16  2:03 PM  Result Value Ref Range Status   Yeast Wet Prep HPF POC PRESENT (A) NONE SEEN Final   Trich, Wet Prep NONE SEEN NONE SEEN Final   Clue Cells Wet Prep HPF POC NONE SEEN NONE SEEN Final   WBC, Wet Prep HPF POC MODERATE (A) NONE SEEN Final   Sperm NONE SEEN  Final    Medical History: Past Medical History:  Diagnosis Date  . Depression     Medications:  Infusions:  . clindamycin (CLEOCIN) IV    . gentamicin    . sodium chloride     Assessment: 26 yof cc abdominal pain with PMH depression. EDP notes low grade temperature and leukocytosis, CT with adnexal cystic structure concerning for tubo-ovarian abscess. Started clindamycin/gentamicin for TOA/PID - pharmacy consulted to dose gentamicin.   Goal of Therapy:  Gentamicin random less than 2 mcg/mL at 6 to 14 hours (Urban-Craig nomogram dosing for gynecologic indication)  Plan:  Gentamicin 5 mg/kg (based on adjusted body weight) = 320 mg  IV Q24H. Will adjust based on nomogram and 10 hour random level.  11/05 0100 gentamicin level <0.5. Continue current regimen.    Carola FrostNathan A Cookson, Pharm.D., BCPS Clinical Pharmacist 11/04/2016,3:38 PM

## 2016-11-04 NOTE — H&P (Addendum)
Reason for Consult: Ovarian mass, infection Referring Physician: Patrick Robinson, MD  Kayla Drake is an 26 y.o. P0 female who presented to the Emergency Room with complaints of abdominal pain, progressively worsening since 2 days ago.  Pain initially started in the RLQ, however over the past few days has radiated to the entire lower abdomen.  Patient does note that she has had some low-grade temperatures for which she is taking Tylenol.  Has had nausea if she tried to eat, or drink certain foods.  Notes that she has not really eaten anything in the past few days.  Denies vaginal bleeding.  Notes a thin watery discharge with no odor, has increased in amount over the past few days. Denies vaginal itching, burning, irritation. Denies urinary or bowel disturbances.   Pertinent Gynecological History: Menses: flow is moderate and regular every month without intermenstrual spotting Contraception: none DES exposure: denies Blood transfusions: none Sexually transmitted diseases: no past history Previous GYN Procedures: none  Last pap: patient has never had one  OB History: G0, P0   Menstrual History: Menarche age: 15 Patient's last menstrual period was 10/28/2016.    Past Medical History:  Diagnosis Date  . Depression   . Meningitis spinal    age 7 months  . Sciatic leg pain    bilateral    Past Surgical History:  Procedure Laterality Date  . TONSILLECTOMY    . WISDOM TOOTH EXTRACTION      Family History  Problem Relation Age of Onset  . Cancer Neg Hx   . Diabetes Neg Hx   . Hypertension Neg Hx     Social History:  reports that she has been smoking cigarettes.  she has never used smokeless tobacco. She reports that she drinks about 0.6 oz of alcohol per week. She reports that she does not use drugs.  Allergies: No Known Allergies  Medications: I have reviewed the patient's current medications. Prior to Admission: No current facility-administered medications on file prior  to encounter.    Current Outpatient Medications on File Prior to Encounter  Medication Sig Dispense Refill  . acetaminophen (TYLENOL) 500 MG tablet Take 500-1,000 mg every 6 (six) hours as needed by mouth.    . Diphenhydramine-PE-APAP (SEVERE COLD & COUGH NIGHTTIME PO) Take 1 tablet 2 (two) times daily by mouth.    . ondansetron (ZOFRAN ODT) 4 MG disintegrating tablet Take 1 tablet (4 mg total) by mouth every 8 (eight) hours as needed for nausea or vomiting. (Patient not taking: Reported on 11/04/2016) 20 tablet 0    ROS Constitutional: negative for chills, fatigue, sweats. Positive for subjective low-grade fevers  Eyes: negative for irritation, redness and visual disturbance Ears, nose, mouth, throat, and face: negative for hearing loss, nasal congestion, snoring and tinnitus Respiratory: negative for asthma, cough, sputum Cardiovascular: negative for chest pain, dyspnea, exertional chest pressure/discomfort, irregular heart beat, palpitations and syncope Gastrointestinal: negative for change in bowel habits, nausea and vomiting.  Positive for abdominal pain Genitourinary: negative for abnormal menstrual periods, genital lesions, sexual problems and vaginal discharge, dysuria and urinary incontinence Integument/breast: negative for breast lump, breast tenderness and nipple discharge Hematologic/lymphatic: negative for bleeding and easy bruising Musculoskeletal:negative for back pain and muscle weakness Neurological: negative for dizziness, headaches, vertigo and weakness Endocrine: negative for diabetic symptoms including polydipsia, polyuria and skin dryness Allergic/Immunologic: negative for hay fever and urticaria     Blood pressure (!) 110/55, pulse 92, temperature 99.8 F (37.7 C), temperature source Oral, resp. rate 16, height   5' 3" (1.6 m), weight 180 lb (81.6 kg), last menstrual period 10/28/2016, SpO2 100 %. Physical Exam  Constitutional: She is oriented to person, place, and  time. She appears well-developed and well-nourished. She appears distressed.  Mildly distressed  HENT:  Head: Normocephalic and atraumatic.  Right Ear: External ear normal.  Left Ear: External ear normal.  Nose: Nose normal.  Mouth/Throat: Oropharynx is clear and moist. No oropharyngeal exudate.  Eyes: Conjunctivae and EOM are normal. Pupils are equal, round, and reactive to light. No scleral icterus.  Neck: Normal range of motion. Neck supple. No JVD present. No thyromegaly present.  Cardiovascular: Normal rate, regular rhythm and normal heart sounds. Exam reveals no gallop and no friction rub.  No murmur heard. Respiratory: Effort normal and breath sounds normal. No respiratory distress. She has no wheezes.  GI: Soft. Bowel sounds are normal. She exhibits no distension and no mass. There is tenderness. There is no rebound and no guarding.  Genitourinary: Uterus normal. Vaginal discharge found.  Genitourinary Comments: CMT present.  Discharge thin, white  Musculoskeletal: Normal range of motion. She exhibits no edema, tenderness or deformity.  Lymphadenopathy:    She has no cervical adenopathy.  Neurological: She is alert and oriented to person, place, and time.  Skin: Skin is warm and dry. No rash noted. No pallor.  Psychiatric: She has a normal mood and affect. Her behavior is normal. Thought content normal.    Results for orders placed or performed during the hospital encounter of 11/04/16 (from the past 48 hour(s))  Lipase, blood     Status: None   Collection Time: 11/04/16 11:04 AM  Result Value Ref Range   Lipase 20 11 - 51 U/L  Comprehensive metabolic panel     Status: Abnormal   Collection Time: 11/04/16 11:04 AM  Result Value Ref Range   Sodium 134 (L) 135 - 145 mmol/L   Potassium 3.4 (L) 3.5 - 5.1 mmol/L   Chloride 104 101 - 111 mmol/L   CO2 19 (L) 22 - 32 mmol/L   Glucose, Bld 165 (H) 65 - 99 mg/dL   BUN 16 6 - 20 mg/dL   Creatinine, Ser 0.82 0.44 - 1.00 mg/dL    Calcium 8.6 (L) 8.9 - 10.3 mg/dL   Total Protein 7.4 6.5 - 8.1 g/dL   Albumin 3.6 3.5 - 5.0 g/dL   AST 29 15 - 41 U/L   ALT 21 14 - 54 U/L   Alkaline Phosphatase 78 38 - 126 U/L   Total Bilirubin 0.9 0.3 - 1.2 mg/dL   GFR calc non Af Amer >60 >60 mL/min   GFR calc Af Amer >60 >60 mL/min    Comment: (NOTE) The eGFR has been calculated using the CKD EPI equation. This calculation has not been validated in all clinical situations. eGFR's persistently <60 mL/min signify possible Chronic Kidney Disease.    Anion gap 11 5 - 15  CBC     Status: Abnormal   Collection Time: 11/04/16 11:04 AM  Result Value Ref Range   WBC 22.7 (H) 3.6 - 11.0 K/uL   RBC 4.65 3.80 - 5.20 MIL/uL   Hemoglobin 13.0 12.0 - 16.0 g/dL   HCT 40.0 35.0 - 47.0 %   MCV 86.0 80.0 - 100.0 fL   MCH 28.0 26.0 - 34.0 pg   MCHC 32.6 32.0 - 36.0 g/dL   RDW 13.4 11.5 - 14.5 %   Platelets 280 150 - 440 K/uL  Urinalysis, Complete w Microscopic  Status: Abnormal   Collection Time: 11/04/16 11:04 AM  Result Value Ref Range   Color, Urine AMBER (A) YELLOW    Comment: BIOCHEMICALS MAY BE AFFECTED BY COLOR   APPearance CLOUDY (A) CLEAR   Specific Gravity, Urine 1.031 (H) 1.005 - 1.030   pH 5.0 5.0 - 8.0   Glucose, UA NEGATIVE NEGATIVE mg/dL   Hgb urine dipstick SMALL (A) NEGATIVE   Bilirubin Urine NEGATIVE NEGATIVE   Ketones, ur 5 (A) NEGATIVE mg/dL   Protein, ur 100 (A) NEGATIVE mg/dL   Nitrite NEGATIVE NEGATIVE   Leukocytes, UA TRACE (A) NEGATIVE   RBC / HPF 6-30 0 - 5 RBC/hpf   WBC, UA 6-30 0 - 5 WBC/hpf   Bacteria, UA NONE SEEN NONE SEEN   Squamous Epithelial / LPF TOO NUMEROUS TO COUNT (A) NONE SEEN   Mucus PRESENT    Amorphous Crystal PRESENT   Pregnancy, urine POC     Status: None   Collection Time: 11/04/16 11:15 AM  Result Value Ref Range   Preg Test, Ur NEGATIVE NEGATIVE    Comment:        THE SENSITIVITY OF THIS METHODOLOGY IS >24 mIU/mL   Wet prep, genital     Status: Abnormal   Collection Time:  11/04/16  2:03 PM  Result Value Ref Range   Yeast Wet Prep HPF POC PRESENT (A) NONE SEEN   Trich, Wet Prep NONE SEEN NONE SEEN   Clue Cells Wet Prep HPF POC NONE SEEN NONE SEEN   WBC, Wet Prep HPF POC MODERATE (A) NONE SEEN   Sperm NONE SEEN   Chlamydia/NGC rt PCR ([facility] only)     Status: None   Collection Time: 11/04/16  2:03 PM  Result Value Ref Range   Specimen source GC/Chlam ENDOCERVICAL    Chlamydia Tr NOT DETECTED NOT DETECTED   N gonorrhoeae NOT DETECTED NOT DETECTED    Comment: (NOTE) 100  This methodology has not been evaluated in pregnant women or in 200  patients with a history of hysterectomy. 300 400  This methodology will not be performed on patients less than 39  years of age.     Ct Abdomen Pelvis W Contrast  Result Date: 11/04/2016 CLINICAL DATA:  C/O abdominal pain x 3 days. C/O lower abdominal pain and bloating. EXAM: CT ABDOMEN AND PELVIS WITH CONTRAST TECHNIQUE: Multidetector CT imaging of the abdomen and pelvis was performed using the standard protocol following bolus administration of intravenous contrast. CONTRAST:  141m ISOVUE-300 IOPAMIDOL (ISOVUE-300) INJECTION 61% COMPARISON:  CT, 04/23/2013 FINDINGS: Lower chest: Clear lung bases.  Heart normal size. Hepatobiliary: No focal liver abnormality is seen. No gallstones, gallbladder wall thickening, or biliary dilatation. Pancreas: Unremarkable. No pancreatic ductal dilatation or surrounding inflammatory changes. Spleen: Normal in size without focal abnormality. Adrenals/Urinary Tract: Adrenal glands are unremarkable. Kidneys are normal, without renal calculi, focal lesion, or hydronephrosis. Bladder is unremarkable. Stomach/Bowel: Stomach is within normal limits. Appendix appears normal. No evidence of bowel wall thickening, distention, or inflammatory changes. Vascular/Lymphatic: No significant vascular findings are present. No enlarged abdominal or pelvic lymph nodes. Reproductive: Complex cystic mass in the right  adnexa, likely the right ovary enlarged with cysts. With appears to be a dilated fallopian tube lies over the anterior superior margin of the mass lies ovary. The combination of the mass/ ovary and presumed fallopian tube measures 7 x 4.6 x 5.2 cm. This is new since the prior CT. Left ovary normal in size. Uterus is retroverted. There is subtle  fat stranding adjacent to the right ovary/adnexal mass. Other: No abdominal wall hernia or abnormality. No abdominopelvic ascites. Musculoskeletal: No acute or significant osseous findings. IMPRESSION: 1. Right adnexal mass, cystic and solid, which is likely the right ovary in combination with a dilated fallopian tube. Consider a tubo-ovarian abscess if this correlates clinically. Over a torsion should be considered if there are no signs of infection. This could be further assessed with transabdominal and endovaginal pelvic ultrasound with Doppler analysis. 2. No other abnormalities. Electronically Signed   By: David  Ormond M.D.   On: 11/04/2016 13:53    Assessment/Plan: 1. Ovarian mass, suspicious for TOA.  CT scan noting ~ 7 cm right adnexal mass involving dilated fallopian tube over enlarged cystic ovary with cystic and solid components. Patient also with low grade fever, nausea, and significant WBC count of 22.7.  Discussed management options with patient, including expectant management (preferred), with IV antibiotics for 24-48 hours, and progressing to surgical management with removal of mass if no improvement or worsening in symptoms.  Patient notes understanding, agrees with plan.  Patient started on Gentamycin and Doxycyline empirically in the ER, will also add Cefoxitin to regimen (per Up To Date).  GC/Cl performed today negative for acute infection with gonorrhea or chlamydia. Continue Tylenol/Ibuprofen for low grade fevers 2. Pain management - patient noting that morphine not really touching her pain, was given Dilaudid which helped more. Will change  medications.  Can also take PO meds with room temperature apple juice (per patient), as drinking other cold fluids makes her nauseated.      , MD [facility]  11/04/2016   

## 2016-11-05 LAB — CBC WITH DIFFERENTIAL/PLATELET
BASOS PCT: 0 %
Basophils Absolute: 0.1 10*3/uL (ref 0–0.1)
EOS ABS: 0.2 10*3/uL (ref 0–0.7)
Eosinophils Relative: 1 %
HCT: 37.6 % (ref 35.0–47.0)
Hemoglobin: 12.4 g/dL (ref 12.0–16.0)
LYMPHS ABS: 2.7 10*3/uL (ref 1.0–3.6)
Lymphocytes Relative: 14 %
MCH: 28.7 pg (ref 26.0–34.0)
MCHC: 33.1 g/dL (ref 32.0–36.0)
MCV: 86.8 fL (ref 80.0–100.0)
Monocytes Absolute: 1.1 10*3/uL — ABNORMAL HIGH (ref 0.2–0.9)
Monocytes Relative: 6 %
NEUTROS PCT: 79 %
Neutro Abs: 15.6 10*3/uL — ABNORMAL HIGH (ref 1.4–6.5)
Platelets: 259 10*3/uL (ref 150–440)
RBC: 4.34 MIL/uL (ref 3.80–5.20)
RDW: 13.3 % (ref 11.5–14.5)
WBC: 19.6 10*3/uL — AB (ref 3.6–11.0)

## 2016-11-05 LAB — GENTAMICIN LEVEL, RANDOM: Gentamicin Rm: <0.5 ug/mL

## 2016-11-05 MED ORDER — DOXYCYCLINE HYCLATE 100 MG IV SOLR
100.0000 mg | Freq: Two times a day (BID) | INTRAVENOUS | Status: AC
  Administered 2016-11-05 – 2016-11-06 (×2): 100 mg via INTRAVENOUS
  Filled 2016-11-05 (×2): qty 100

## 2016-11-05 NOTE — Progress Notes (Signed)
Hospital Day # 1, admitted for IV antibiotics for treatment of right tubo-ovarian abscess.   Subjective: Patient states she is feeling somewhat better.  Is able to move a bit more without as much pain.  She also notes that she was finally able to tolerate food last night.  Denies any further febrile morbidity.   Objective: Temp:  [97.6 F (36.4 C)-100.9 F (38.3 C)] 98.2 F (36.8 C) (11/05 1216) Pulse Rate:  [66-108] 93 (11/05 1216) Resp:  [16-20] 16 (11/05 1216) BP: (90-125)/(44-108) 113/66 (11/05 1216) SpO2:  [89 %-100 %] 100 % (11/05 1216)  Physical Exam:  General: alert and no distress  Lungs: clear to auscultation bilaterally Heart: regular rate and rhythm, S1, S2 normal, no murmur, click, rub or gallop Abdomen: normal findings: bowel sounds normal, no masses palpable and soft and abnormal findings:  mild tenderness in the RLQ Pelvis:Bleeding: no bleeding present.  Extremities: DVT Evaluation: No evidence of DVT seen on physical exam. Negative Homan's sign. No cords or calf tenderness. No significant calf/ankle edema.   Labs:  CBC Latest Ref Rng & Units 11/05/2016 11/04/2016 11/30/2015  WBC 3.6 - 11.0 K/uL 19.6(H) 22.7(H) 10.1  Hemoglobin 12.0 - 16.0 g/dL 16.112.4 09.613.0 04.514.8  Hematocrit 35.0 - 47.0 % 37.6 40.0 44.7  Platelets 150 - 440 K/uL 259 280 415(H)    Imaging:  No  New imaging performed.    Assessment/Plan: 1. Tubo-ovarian abscess - to continue IV antibiotics as previously prescribed for likely total of 48 hours.  After this, can determine for need for progression to surgery. WBC count beginning to trend downward.  Patient symptomatically is improving (able to move around more, tolerating diet, no further low-grade fevers).  2 Continue with current pain management.  3. Advance diet as tolerated 4. Encourage ambulation as tolerated.    Will sign of to oncoming call doctor, Dr. Brennan Baileyavid Evans, of [facility]   Hildred Laserherry, Maday Guarino, MD [facility]

## 2016-11-06 DIAGNOSIS — R509 Fever, unspecified: Secondary | ICD-10-CM

## 2016-11-06 DIAGNOSIS — N7093 Salpingitis and oophoritis, unspecified: Secondary | ICD-10-CM

## 2016-11-06 DIAGNOSIS — R1031 Right lower quadrant pain: Secondary | ICD-10-CM

## 2016-11-06 DIAGNOSIS — D72829 Elevated white blood cell count, unspecified: Secondary | ICD-10-CM

## 2016-11-06 LAB — CBC
HEMATOCRIT: 36.2 % (ref 35.0–47.0)
HEMOGLOBIN: 11.9 g/dL — AB (ref 12.0–16.0)
MCH: 28.6 pg (ref 26.0–34.0)
MCHC: 32.9 g/dL (ref 32.0–36.0)
MCV: 86.9 fL (ref 80.0–100.0)
Platelets: 285 10*3/uL (ref 150–440)
RBC: 4.17 MIL/uL (ref 3.80–5.20)
RDW: 13 % (ref 11.5–14.5)
WBC: 13.6 10*3/uL — AB (ref 3.6–11.0)

## 2016-11-06 MED ORDER — DOXYCYCLINE HYCLATE 100 MG IV SOLR
100.0000 mg | Freq: Two times a day (BID) | INTRAVENOUS | Status: DC
  Administered 2016-11-06: 100 mg via INTRAVENOUS
  Filled 2016-11-06 (×3): qty 100

## 2016-11-06 NOTE — Progress Notes (Signed)
Patient ID: Gardiner RamusAutumn S Radliff, female   DOB: 07/09/1990, 26 y.o.   MRN: [id number]     Subjective:    She is feeling much better today.  Pain has decreased.  She is eating drinking and having bowel movements without difficulty.  She continues to have some abdominal/pelvic pain but reports it is much improved.  Objective:    Patient Vitals for the past 2 hrs:  BP Temp Temp src Pulse Resp SpO2  11/06/16 0821 (!) 114/59 97.9 F (36.6 C) Oral 71 18 96 %   No intake/output data recorded.  Labs: Results for orders placed or performed during the hospital encounter of 11/04/16 (from the past 24 hour(s))  CBC     Status: Abnormal   Collection Time: 11/06/16  6:54 AM  Result Value Ref Range   WBC 13.6 (H) 3.6 - 11.0 K/uL   RBC 4.17 3.80 - 5.20 MIL/uL   Hemoglobin 11.9 (L) 12.0 - 16.0 g/dL   HCT 65.736.2 84.635.0 - 96.247.0 %   MCV 86.9 80.0 - 100.0 fL   MCH 28.6 26.0 - 34.0 pg   MCHC 32.9 32.0 - 36.0 g/dL   RDW 95.213.0 84.111.5 - 32.414.5 %   Platelets 285 150 - 440 K/uL    Medications      Medication List    ASK your doctor about these medications   acetaminophen 500 MG tablet Commonly known as:  TYLENOL   ondansetron 4 MG disintegrating tablet Commonly known as:  ZOFRAN ODT Take 1 tablet (4 mg total) by mouth every 8 (eight) hours as needed for nausea or vomiting.   SEVERE COLD & COUGH NIGHTTIME PO        Abdomen is soft mildly tender to palpation no guarding or rebound. Assessment:    Likely TOA-much improved with IV antibiotics.  Significant decrease in white count noted.  Plan:    Continue IV antibiotics.  Prepare for discharge tomorrow.  Elonda Huskyavid J. Aleister Lady, M.D. 11/06/2016 9:40 AM

## 2016-11-07 DIAGNOSIS — F17210 Nicotine dependence, cigarettes, uncomplicated: Secondary | ICD-10-CM

## 2016-11-07 DIAGNOSIS — D72829 Elevated white blood cell count, unspecified: Secondary | ICD-10-CM

## 2016-11-07 DIAGNOSIS — R1031 Right lower quadrant pain: Secondary | ICD-10-CM

## 2016-11-07 DIAGNOSIS — N7093 Salpingitis and oophoritis, unspecified: Secondary | ICD-10-CM

## 2016-11-07 MED ORDER — DOXYCYCLINE HYCLATE 100 MG PO CAPS: 100.0000 mg | ORAL_CAPSULE | Freq: Two times a day (BID) | ORAL | 0 refills | Status: AC

## 2016-11-07 MED ORDER — METRONIDAZOLE 500 MG PO TABS: 500.0000 mg | ORAL_TABLET | Freq: Two times a day (BID) | ORAL | 0 refills | Status: AC

## 2016-11-07 NOTE — Discharge Summary (Signed)
    Discharge Summary  Admit date: 11/04/2016  Discharge Date and Time:11/07/2016  8:05 AM  Discharge to:  Home  Admission Diagnosis: Active Problems:   Tubo-ovarian abscess   Discharge  Diagnoses: Same  OR Procedures:                                Discharge Day Progress Note:   Subjective:   The patient does not have complaints.  She is ambulating well. She is taking PO well.  Her pain is much improved today. She is urinating without difficulty and is passing flatus.   Objective:  BP 111/71 (BP Location: Right Arm)   Pulse 80   Temp 98.3 F (36.8 C) (Oral)   Resp 18   Ht 5\' 3"  (1.6 m)   Wt 180 lb (81.6 kg)   LMP 10/28/2016   SpO2 100%   BMI 31.89 kg/m     Abdomen:                          Soft, minimally tender.    Assessment:   Doing well.     TOA resolving with antibiotics.   Afebrile-declining white count-improving pain.     Plan:        Discharge home.                       Medications as directed to complete 1 month of antibiotics or until resolution of TOA/cystic adnexal mass.  Hospital Course: Patient presented with abdominal pain and right-sided mass consistent with TOA.  Her white count was elevated.  She received intravenous antibiotics and pain medication.  Over the next few days her pain gradually improved and her white count decreased accordingly.  At the time of discharge she is ambulating voiding without difficulty she was having minimal pain.  Condition at Discharge:  good Discharge Medications:  Doxycycline and Flagyl to complete a one-month course    Follow Up:   Follow-up Information    Hildred Laserherry, Anika, MD Follow up in 2 week(s).   Specialties:  Obstetrics and Gynecology, Radiology Contact information: [address] [place] [address] [phone]           Elonda Huskyavid J. Evans, M.D. 11/07/2016 8:05 AM

## 2016-11-07 NOTE — Progress Notes (Signed)
Pt discharged home.  Discharge instructions, prescriptions and follow up appointment given to and reviewed with pt.  Pt verbalized understanding.  Escorted by auxillary. 

## 2016-11-09 LAB — CULTURE, BLOOD (ROUTINE X 2)
CULTURE: NO GROWTH
CULTURE: NO GROWTH

## 2016-11-10 NOTE — Progress Notes (Signed)
PHONE CONVERSATION  (patient's husband) I received a message that the patient's husband was irate and was calling everyone he could over the last 3 days with complaints and general anger. I was given his phone number and I called him to find out what the issue was. I inquired about his wife's condition and inquired whether I could speak with her and he said no I need to speak with him. His main complaint was that she had significant nausea and vomiting and was unable to keep anything down.  He said that she had been sick ever since 6 hours after her discharge from the hospital.  He stated that the antibiotics that she received were not the same as the antibiotics she had in the hospital.  He said they were general antibiotics that were waiting out her whole immune system and killing all the bacteria in her body so that she did not have any good bacteria left.  He stated he was forcing her to eat yogurt and ingest lactobacilli in an effort to replenish her missing bacteria.  He inquired as to why she did not receive any nausea medicine or pain medicine at the time of her discharge.  He complained about the price of the antibiotics being $300 and that they were not generic.  He wondered why she did not have immediate surgery to fix the problem.  He thought maybe the antibiotics were making her nauseated.  He said her treatment was not appropriate because she never had a follow-up ultrasound in the hospital to prove that her cyst was gone.  He said she had been discharged from the hospital too quickly and that while she was there nothing was done for her. After allowing his discourse I was finally able to address some of his concerns. 1.  I recommended that if she were having worsening pain and was unable to eat or drink anything she needs to go back to the hospital that this was not a good condition.  He refused to take her back to the hospital and said that she was not going back there.  He stated that she had  to be treated at home or nothing.  I explained to him how serious this could be, if she got worse and he again declined to take her to the hospital.  During the rest of the conversation I repeatedly brought up the possibility of her returning to the hospital and was finally able to get a concessions from him that if antiemetic medication did not improve her condition that he would take her to the hospital on Monday-that was the soonest he would agree to. 2.  I explained to him that the antibiotic she received in the hospital were IV and that I gave him the cheapest and most readily available antibiotics I could find that would treat her condition.  I explained that these were generic.  At which time he agreed they could be obtained generically but he was unwilling to wait for a generic prescription so he paid significantly more for them. 3.  I explained to him that the treatment for TOA was antibiotics and that if the patient improved and was able to eat and drink -discharge with home antibiotics was the appropriate treatment with follow-up 4-8 weeks later by ultrasound.  We discussed the inflammation caused by intra-abdominal infection and the risks of surgery during this time.  I explained to him that I did not expect immediate resolution of her cyst and that  an ultrasound would not have been helpful.  We discussed her declining white blood cell count, her absence of pain, and her ability to eat and drink without difficulty at the time of her discharge.  I also explained to him that because she was able to eat and drink because she had no pain at discharge pain medication and antiemetics were not indicated as discharge medications.  I explained that if she had become worse after discharge and was not doing well at home she should come back to the hospital-he again declined. 4.  We discussed her discharge and I reminded him that I had kept her an extra day and given her an extra day of IV antibiotics after her  pain and white blood cell count had improved and after she was able to eat and drink without problem.  He seemed to agree that this was the case.  I explained to him that the antibiotic she received in the hospital and the IV fluids she received in the hospital where a treatment and that is why she had improved so rapidly.  We again discussed why surgery was not warranted. 5.  I discussed calling in Phenergan for her to improve her nausea and allow her to eat.  Again I stressed that if this did not work she should go to the hospital.  The soonest he would agree to was Monday.  I also said that if he thought the antibiotics were making her sick they could stop the Flagyl for the next 36 hours and see if that was part of what was causing her nausea.  If that was the case we could change out that medication for another.  He said he would try that. 6.  Again, while she was in the background and a occasionally discussed things off the phone he would not put her on the line to speak with me directly.  He kept relaying the plan to her as we spoke. We ended the conversation on a cordial note.  I again stressed the importance of follow-up and return to the hospital should her condition not improve or decline.

## 2016-11-21 ENCOUNTER — Encounter: Payer: Self-pay | Admitting: Obstetrics and Gynecology

## 2018-04-23 IMAGING — CT CT ABD-PELV W/ CM
2 of 4 series · 15 of 46 positions shown, 17 images · IV contrast (APPLIED)
Comparison: CT, 04/23/2013

CLINICAL DATA: C/O abdominal pain x 3 days. C/O lower abdominal
pain and bloating.

EXAM:
CT ABDOMEN AND PELVIS WITH CONTRAST
TECHNIQUE: Multidetector CT imaging of the abdomen and pelvis was performed
using the standard protocol following bolus administration of
intravenous contrast.
CONTRAST:  100mL [id number] IOPAMIDOL ([id number]) INJECTION 61%

[Series 2: routine abd/pel with · axial · 0.71mm/px · z∈[-547,-147]mm · 12 of 92 slices shown, 14 images]
[im 8/92  soft-tissue]
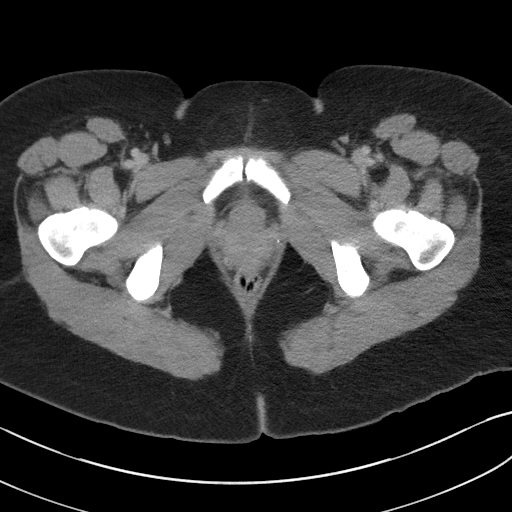
[im 8/92  bone]
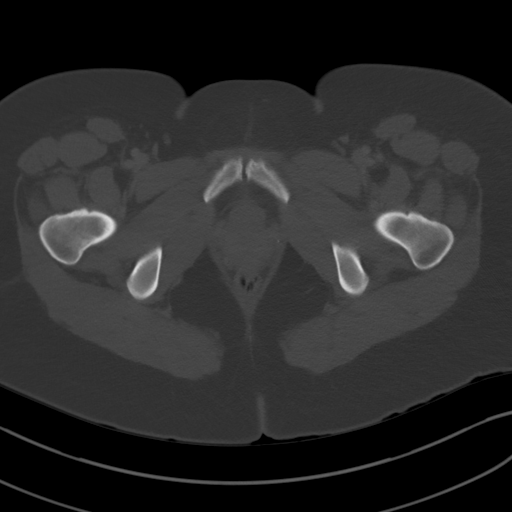
[im 15/92  soft-tissue]
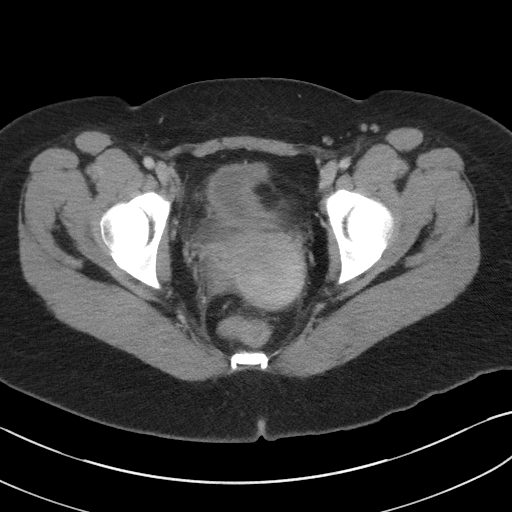
[im 22/92  soft-tissue]
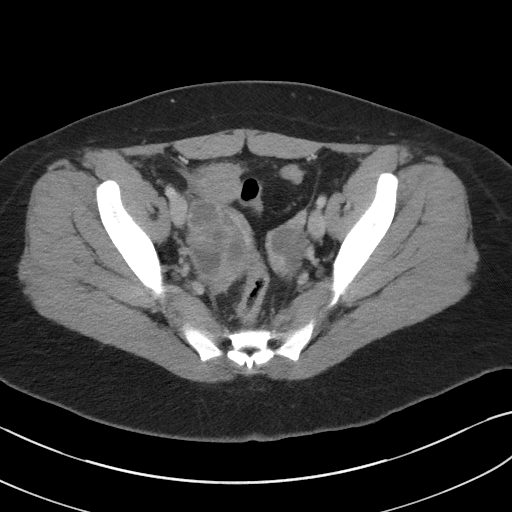
[im 30/92  soft-tissue]
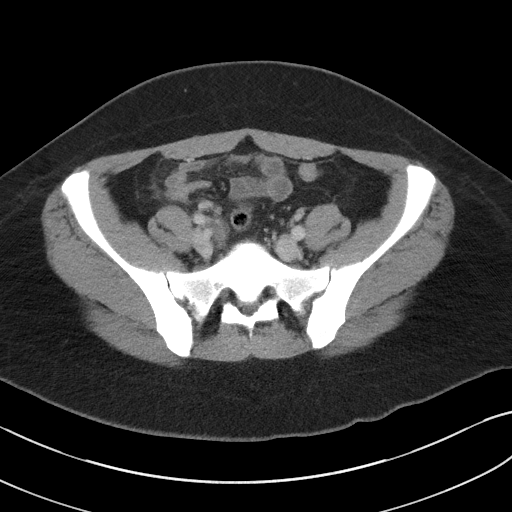
[im 37/92  soft-tissue]
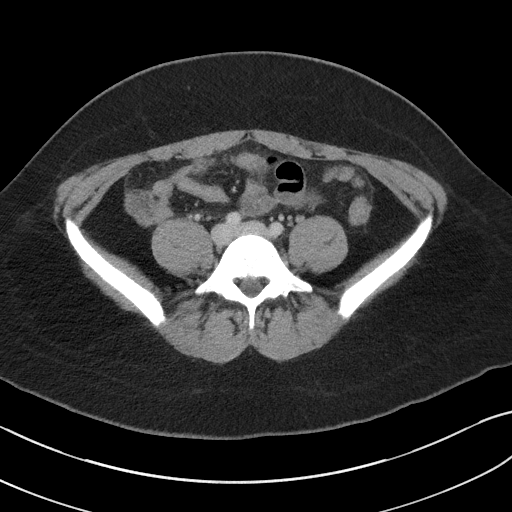
[im 44/92  soft-tissue]
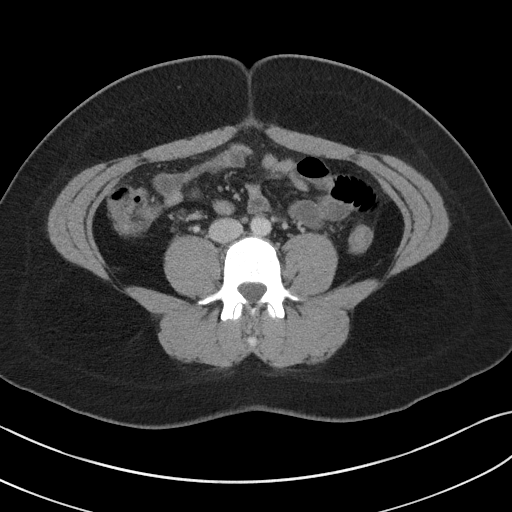
[im 51/92  soft-tissue]
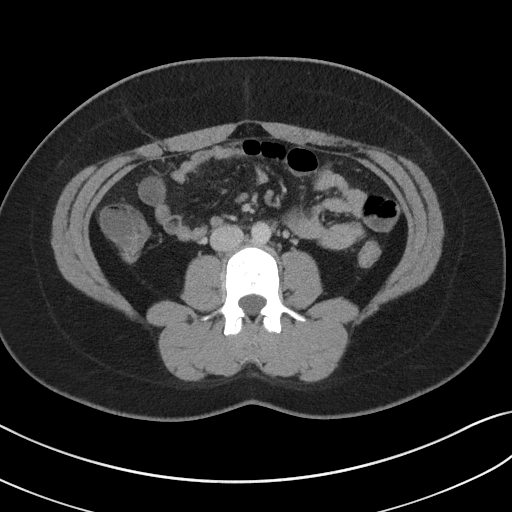
[im 59/92  soft-tissue]
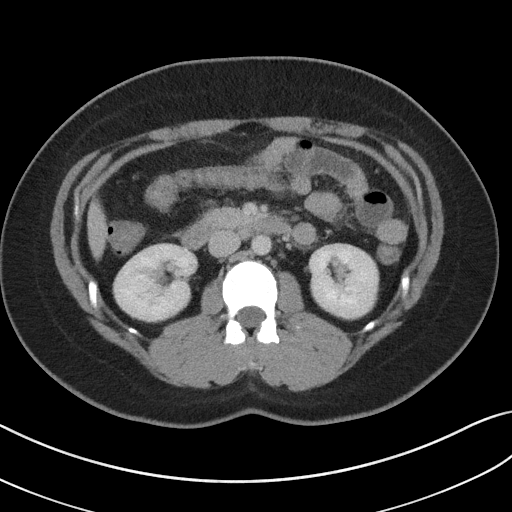
[im 66/92  soft-tissue]
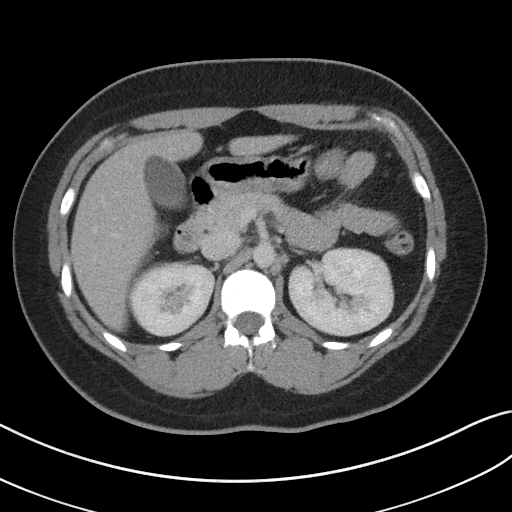
[im 66/92  bone]
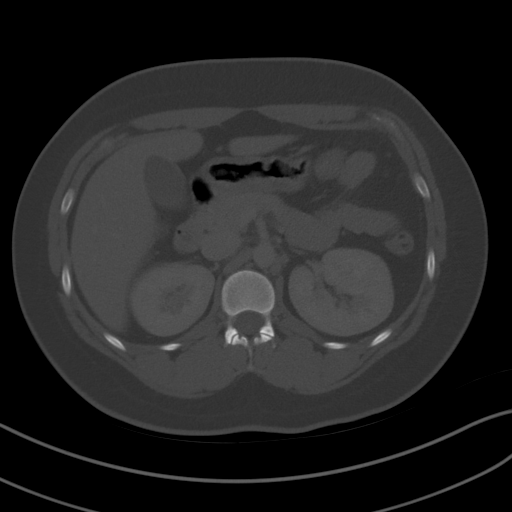
[im 73/92  soft-tissue]
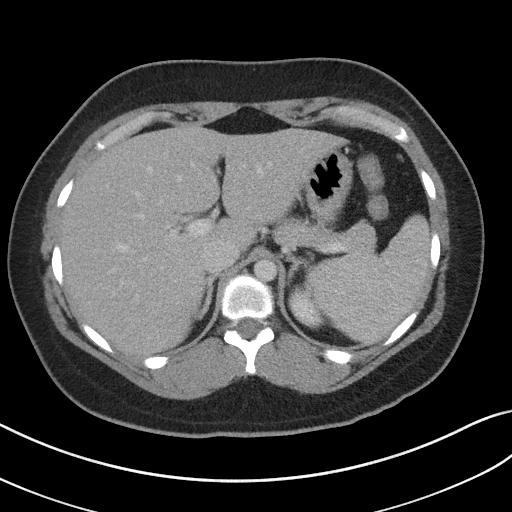
[im 81/92  soft-tissue]
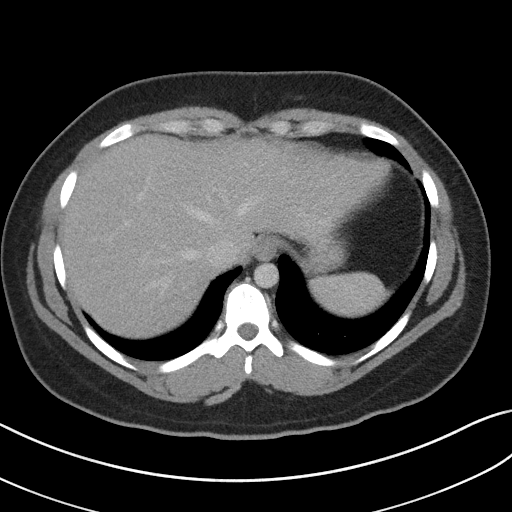
[im 88/92  soft-tissue]
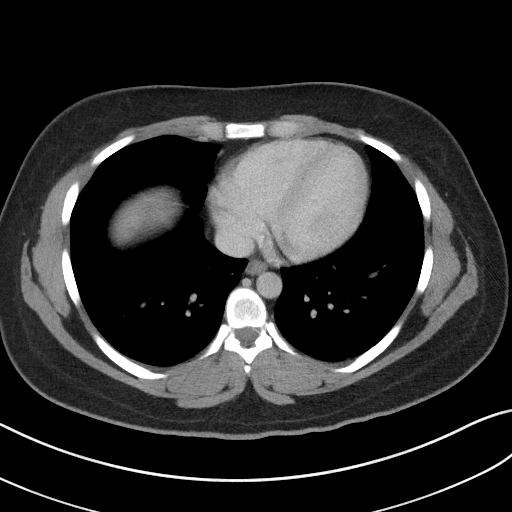

[Series 5: coronal st · coronal · 0.68mm/px · 3 of 89 slices shown]
[im 30/89  soft-tissue]
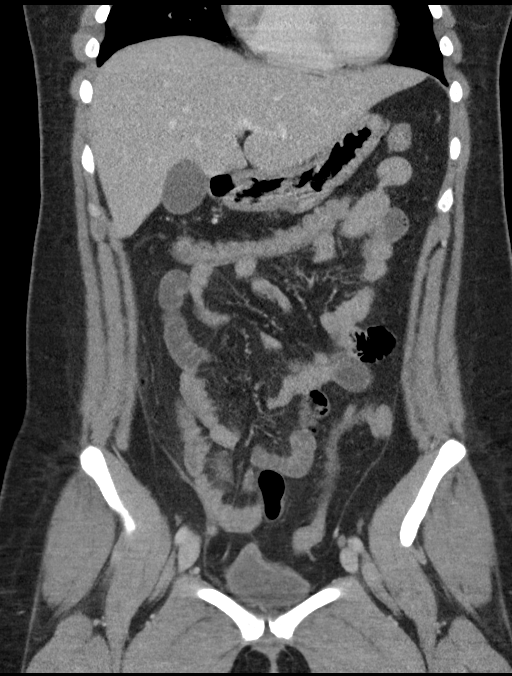
[im 40/89  soft-tissue]
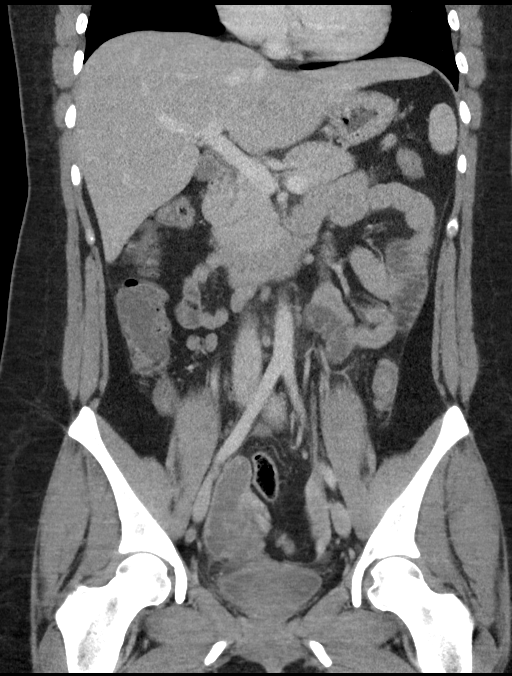
[im 49/89  soft-tissue]
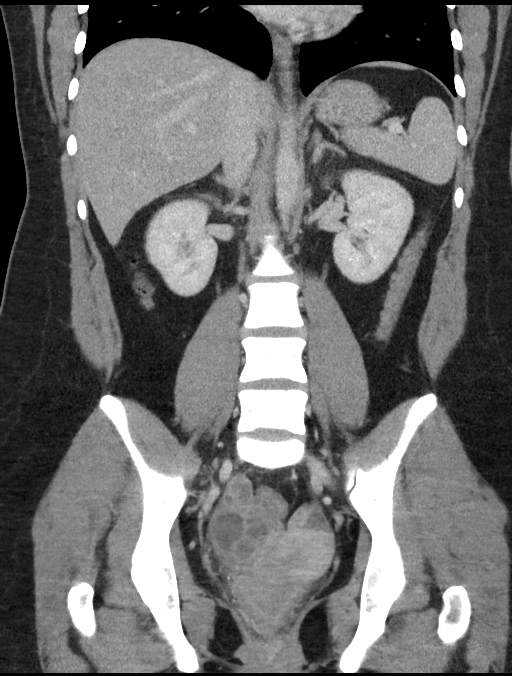

[15 of 46 positions shown; findings below may reference images not displayed]

FINDINGS: Lower chest: Clear lung bases.  Heart normal size.

Hepatobiliary: No focal liver abnormality is seen. No gallstones,
gallbladder wall thickening, or biliary dilatation.

Pancreas: Unremarkable. No pancreatic ductal dilatation or
surrounding inflammatory changes.

Spleen: Normal in size without focal abnormality.

Adrenals/Urinary Tract: Adrenal glands are unremarkable. Kidneys are
normal, without renal calculi, focal lesion, or hydronephrosis.
Bladder is unremarkable.

Stomach/Bowel: Stomach is within normal limits. Appendix appears
normal. No evidence of bowel wall thickening, distention, or
inflammatory changes.

Vascular/Lymphatic: No significant vascular findings are present. No
enlarged abdominal or pelvic lymph nodes.

Reproductive: Complex cystic mass in the right adnexa, likely the
right ovary enlarged with cysts. With appears to be a dilated
fallopian tube lies over the anterior superior margin of the mass
lies ovary. The combination of the mass/ ovary and presumed
fallopian tube measures 7 x 4.6 x 5.2 cm. This is new since the
prior CT. Left ovary normal in size. Uterus is retroverted. There is
subtle fat stranding adjacent to the right ovary/adnexal mass.

Other: No abdominal wall hernia or abnormality. No abdominopelvic
ascites.

Musculoskeletal: No acute or significant osseous findings.
IMPRESSION: 1. Right adnexal mass, cystic and solid, which is likely the right
ovary in combination with a dilated fallopian tube. Consider a
tubo-ovarian abscess if this correlates clinically. Over a torsion
should be considered if there are no signs of infection. This could
be further assessed with transabdominal and endovaginal pelvic
ultrasound with Doppler analysis.
2. No other abnormalities.

## 2022-09-21 ENCOUNTER — Other Ambulatory Visit: Payer: Self-pay

## 2022-09-21 ENCOUNTER — Encounter: Payer: Self-pay | Admitting: Emergency Medicine

## 2022-09-21 ENCOUNTER — Emergency Department
Admission: EM | Admit: 2022-09-21 | Discharge: 2022-09-21 | Discharge status: Against Medical Advice | Payer: Self-pay | Attending: Emergency Medicine | Admitting: Emergency Medicine

## 2022-09-21 ENCOUNTER — Emergency Department: Payer: Self-pay

## 2022-09-21 DIAGNOSIS — R079 Chest pain, unspecified: Secondary | ICD-10-CM | POA: Insufficient documentation

## 2022-09-21 DIAGNOSIS — Z5321 Procedure and treatment not carried out due to patient leaving prior to being seen by health care provider: Secondary | ICD-10-CM | POA: Insufficient documentation

## 2022-09-21 DIAGNOSIS — R0602 Shortness of breath: Secondary | ICD-10-CM | POA: Insufficient documentation

## 2022-09-21 LAB — CBC
HCT: 45.4 % (ref 36.0–46.0)
Hemoglobin: 14.8 g/dL (ref 12.0–15.0)
MCH: 29.9 pg (ref 26.0–34.0)
MCHC: 32.6 g/dL (ref 30.0–36.0)
MCV: 91.7 fL (ref 80.0–100.0)
Platelets: 314 10*3/uL (ref 150–400)
RBC: 4.95 MIL/uL (ref 3.87–5.11)
RDW: 12 % (ref 11.5–15.5)
WBC: 6.8 10*3/uL (ref 4.0–10.5)
nRBC: 0 % (ref 0.0–0.2)

## 2022-09-21 LAB — BASIC METABOLIC PANEL
Anion gap: 10 (ref 5–15)
BUN: 17 mg/dL (ref 6–20)
CO2: 23 mmol/L (ref 22–32)
Calcium: 9 mg/dL (ref 8.9–10.3)
Chloride: 102 mmol/L (ref 98–111)
Creatinine, Ser: 0.86 mg/dL (ref 0.44–1.00)
GFR, Estimated: >60 mL/min (ref >60)
Glucose, Bld: 86 mg/dL (ref 70–99)
Potassium: 3.4 mmol/L — ABNORMAL LOW (ref 3.5–5.1)
Sodium: 135 mmol/L (ref 135–145)

## 2022-09-21 LAB — TROPONIN I (HIGH SENSITIVITY): Troponin I (High Sensitivity): <2 ng/L (ref <18)

## 2022-09-21 NOTE — ED Triage Notes (Signed)
Patient ambulatory to triage with steady gait, without difficulty or distress noted; pt reports mid upper CP radiating into left arm/neck/shoulders accomp by [facility] since yesterday

## 2023-07-13 ENCOUNTER — Encounter: Payer: Self-pay | Admitting: Emergency Medicine

## 2023-07-13 ENCOUNTER — Other Ambulatory Visit: Payer: Self-pay

## 2023-07-13 ENCOUNTER — Emergency Department
Admission: EM | Admit: 2023-07-13 | Discharge: 2023-07-13 | Disposition: A | Discharge status: Home / Self Care | Payer: Self-pay | Attending: Emergency Medicine | Admitting: Emergency Medicine

## 2023-07-13 DIAGNOSIS — Z23 Encounter for immunization: Secondary | ICD-10-CM | POA: Insufficient documentation

## 2023-07-13 DIAGNOSIS — W540XXA Bitten by dog, initial encounter: Secondary | ICD-10-CM | POA: Insufficient documentation

## 2023-07-13 DIAGNOSIS — S71152A Open bite, left thigh, initial encounter: Secondary | ICD-10-CM | POA: Insufficient documentation

## 2023-07-13 MED ORDER — AMOXICILLIN-POT CLAVULANATE 875-125 MG PO TABS
1.0000 | ORAL_TABLET | Freq: Once | ORAL | Status: AC
  Administered 2023-07-13: 1 via ORAL
  Filled 2023-07-13: qty 1

## 2023-07-13 MED ORDER — HYDROCODONE-ACETAMINOPHEN 5-325 MG PO TABS
1.0000 | ORAL_TABLET | Freq: Four times a day (QID) | ORAL | 0 refills | Status: AC | PRN
Start: 2023-07-13 — End: ?

## 2023-07-13 MED ORDER — AMOXICILLIN-POT CLAVULANATE 875-125 MG PO TABS: 1.0000 | ORAL_TABLET | Freq: Two times a day (BID) | ORAL | 0 refills | Status: AC

## 2023-07-13 MED ORDER — HYDROCODONE-ACETAMINOPHEN 5-325 MG PO TABS
2.0000 | ORAL_TABLET | Freq: Once | ORAL | Status: AC
  Administered 2023-07-13: 2 via ORAL
  Filled 2023-07-13: qty 2

## 2023-07-13 MED ORDER — TETANUS-DIPHTH-ACELL PERTUSSIS 5-2.5-18.5 LF-MCG/0.5 IM SUSY
0.5000 mL | PREFILLED_SYRINGE | Freq: Once | INTRAMUSCULAR | Status: AC
  Administered 2023-07-13: 0.5 mL via INTRAMUSCULAR
  Filled 2023-07-13: qty 0.5

## 2023-07-13 NOTE — ED Triage Notes (Signed)
 Pt via POV from home. Pt has a dog bite to the L thigh, report it is her dog. States unknown tetanus. Bleeding controlled. States that her significant other is going to get the dog put down today. Pt is A&OX4 and NAD

## 2023-07-13 NOTE — ED Notes (Addendum)
 Spoke with [facility] Dept animal control  then [employer]

## 2023-07-13 NOTE — ED Provider Notes (Signed)
 [facility] Provider Note   Event Date/Time   First MD Initiated Contact with Patient 07/13/23 [phone]     (approximate)  History   Animal Bite   HPI  Kayla Drake is a 33 y.o. female who reports healthy.  Not pregnant not breast-feeding  She relates that this morning she was bit in her left eye by her own dog.  Dog weighs about 50 pounds.  She advised the dog with his normal health there was a dog fight in the home between 2 other dogs and she was bitten in the left thigh.   Patient advises that the dog is her own dog.  She believes that it was up-to-date on rabies vaccination, but at the current time she is not certain if it is still up-to-date.  She does however advise a document as normal health acting and behaving normally stays in house and that there was a dog fight between 2 of the other dogs in the house when this occurred.  She reports it is fairly tender around the area of the bite.  It bled some and a little bit of fat came out of the wound.  She advised that she is aware from other people that usually do not close these wounds that you have to be treated with antibiotics and watch for infection.  She is not up-to-date on her tetanus     Physical Exam   Triage Vital Signs: ED Triage Vitals  Encounter Vitals Group     BP 07/13/23 0933 119/76     Girls Systolic BP Percentile --      Girls Diastolic BP Percentile --      Boys Systolic BP Percentile --      Boys Diastolic BP Percentile --      Pulse Rate 07/13/23 0933 72     Resp 07/13/23 0933 18     Temp 07/13/23 0933 97.6 F (36.4 C)     Temp Source 07/13/23 0933 Oral     SpO2 07/13/23 0933 100 %     Weight 07/13/23 0931 152 lb (68.9 kg)     Height 07/13/23 0931 5' 3 (1.6 m)     Head Circumference --      Peak Flow --      Pain Score 07/13/23 0930 3     Pain Loc --      Pain Education --      Exclude from Growth Chart --     Most recent vital signs: Vitals:   07/13/23 0933   BP: 119/76  Pulse: 72  Resp: 18  Temp: 97.6 F (36.4 C)  SpO2: 100%     General: Awake, no distress.  CV:  Good peripheral perfusion.  Resp:  Normal effort.  Abd:  No distention.  Other:  Examined skin, no areas of injury or bite wounds with exception to the left lateral mid thigh.  Patient has 2 punctate wounds that are each approximately half centimeter to 1 cm in size with teeny amount of subcutaneous fat visible.  Bleeding is controlled.  Also a small abrasion in the area.  It is tender to palpation.  There is no obvious compartment syndrome.  She demonstrates good use of the leg but is quite sore through range of motion over the left lateral thigh.   ED Results / Procedures / Treatments   Labs (all labs ordered are listed, but only abnormal results are displayed) Labs Reviewed - No data to display  EKG     RADIOLOGY     PROCEDURES:  Critical Care performed: No  Procedures   MEDICATIONS ORDERED IN ED: Medications  Tdap (BOOSTRIX) injection 0.5 mL (has no administration in time range)  HYDROcodone -acetaminophen  (NORCO/VICODIN) 5-325 MG per tablet 2 tablet (has no administration in time range)  amoxicillin -clavulanate (AUGMENTIN ) 875-125 MG per tablet 1 tablet (has no administration in time range)     IMPRESSION / MDM / ASSESSMENT AND PLAN / ED COURSE  I reviewed the triage vital signs and the nursing notes.                              Differential diagnosis includes, but is not limited to, animal bite from domesticated dog.  Nursing staff will update animal control.  Patient herself reports that the dog's previously been vaccinated against rabies but not certain if it is currently up-to-date it may be out of date.  It does seem like a little relatively low risk exposure for rabies, and animal control will be contacted for appropriate follow-up.  Will update tetanus.  Provide Augmentin .  Discussed careful return precautions pain control plan etc.  Patient  did not drive herself here today.  Has used hydrocodone  in the past with good effect for pain control, understands not to drive while taking this medication.  She will monitor closely for signs or symptoms of infection  Patient's presentation is most consistent with acute complicated illness / injury requiring diagnostic workup.   ----------------------------------------- 11:29 AM on 07/13/2023 ----------------------------------------- Pain well-controlled.  Patient's advising that they have contacted animal control, and are on the list as advised by the animal control team.  Discussed careful return precautions, no driving with hydrocodone , and we carefully reviewed return signs and symptoms especially those of potential developing or deep infection  Return precautions and treatment recommendations and follow-up discussed with the patient who is agreeable with the plan.        FINAL CLINICAL IMPRESSION(S) / ED DIAGNOSES   Final diagnoses:  Dog bite, initial encounter     Rx / DC Orders   ED Discharge Orders          Ordered    HYDROcodone -acetaminophen  (NORCO/VICODIN) 5-325 MG tablet  Every 6 hours PRN        07/13/23 0953    amoxicillin -clavulanate (AUGMENTIN ) 875-125 MG tablet  2 times daily        07/13/23 0953             Note:  This document was prepared using Dragon voice recognition software and may include unintentional dictation errors.   Dicky Anes, MD 07/13/23 1130

## 2023-07-13 NOTE — ED Notes (Signed)
 Spoke to [facility].   Pt resides in [facility]

## 2023-07-13 NOTE — Discharge Instructions (Addendum)
 No driving today or while using hydrocodone . Use only as prescribed.

## 2023-07-13 NOTE — ED Notes (Addendum)
 Pts family member called animal control.   Also, I called animal control spoke with 911, then animal control to call back
